# Patient Record
Sex: Male | Born: 1995 | Hispanic: Yes | Marital: Single | Smoking: Never smoker
Health system: Southern US, Community
[De-identification: ages and names within clinical notes are randomized; demographics above are authoritative.]

---
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
## 2021-04-12 ENCOUNTER — Encounter (HOSPITAL_BASED_OUTPATIENT_CLINIC_OR_DEPARTMENT_OTHER): Payer: Self-pay

## 2021-04-12 ENCOUNTER — Other Ambulatory Visit: Payer: Self-pay

## 2021-04-12 ENCOUNTER — Emergency Department (HOSPITAL_BASED_OUTPATIENT_CLINIC_OR_DEPARTMENT_OTHER): Payer: Self-pay | Admitting: Radiology

## 2021-04-12 ENCOUNTER — Emergency Department (HOSPITAL_BASED_OUTPATIENT_CLINIC_OR_DEPARTMENT_OTHER)
Admission: EM | Admit: 2021-04-12 | Discharge: 2021-04-13 | Disposition: A | Discharge status: Home / Self Care | Payer: Self-pay | Attending: Emergency Medicine | Admitting: Emergency Medicine

## 2021-04-12 DIAGNOSIS — W500XXA Accidental hit or strike by another person, initial encounter: Secondary | ICD-10-CM | POA: Insufficient documentation

## 2021-04-12 DIAGNOSIS — Y9366 Activity, soccer: Secondary | ICD-10-CM | POA: Insufficient documentation

## 2021-04-12 DIAGNOSIS — S42022A Displaced fracture of shaft of left clavicle, initial encounter for closed fracture: Secondary | ICD-10-CM | POA: Insufficient documentation

## 2021-04-12 DIAGNOSIS — S4990XA Unspecified injury of shoulder and upper arm, unspecified arm, initial encounter: Secondary | ICD-10-CM

## 2021-04-12 MED ORDER — ACETAMINOPHEN 500 MG PO TABS
1000.0000 mg | ORAL_TABLET | Freq: Once | ORAL | Status: AC
  Administered 2021-04-13: 1000 mg via ORAL
  Filled 2021-04-12: qty 2
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
## 2021-04-13 MED ORDER — HYDROCODONE-ACETAMINOPHEN 5-325 MG PO TABS: 1.0000 | ORAL_TABLET | Freq: Three times a day (TID) | ORAL | 0 refills | Status: AC | PRN

## 2021-04-13 MED ORDER — HYDROCODONE-ACETAMINOPHEN 5-325 MG PO TABS: 1.0000 | ORAL_TABLET | Freq: Three times a day (TID) | ORAL | 0 refills | Status: DC | PRN

## 2021-04-13 NOTE — Discharge Instructions (Signed)
For pain control you may take at 1000 mg of Tylenol every 8 hours scheduled.  In addition you can take 0.5 to 1 tablet of Norco/Vicodin every 8 hours for pain not controlled with the scheduled Tylenol.  

## 2021-04-20 NOTE — Progress Notes (Signed)
Please enter orders for surgery scheduled for 04-25-21.

## 2021-04-20 NOTE — Patient Instructions (Addendum)
DUE TO COVID-19 ONLY ONE VISITOR IS ALLOWED TO COME WITH YOU AND STAY IN THE WAITING ROOM ONLY DURING PRE OP AND PROCEDURE.   **NO VISITORS ARE ALLOWED IN THE SHORT STAY AREA OR RECOVERY ROOM!!**    COVID SWAB TESTING MUST BE COMPLETED ON: Monday, 04-24-21 @ 11:15 AM   [address] [place], [place] [address]  (Must self quarantine after testing. Follow instructions on handout.)        Your procedure is scheduled on:  Tuesday, 04-25-21   Report to [facility] Main  Entrance    Report to admitting at 10:00 AM   Call this number if you have problems the morning of surgery [phone]   Do not eat food :After Midnight.   May have liquids until 9:00 AM  day of surgery  CLEAR LIQUID DIET  Foods Allowed                                                                     Foods Excluded  Water, Black Coffee and tea, regular and decaf             liquids that you cannot  Plain Jell-O in any flavor  (No red)                                   see through such as: Fruit ices (not with fruit pulp)                                      milk, soups, orange juice              Iced Popsicles (No red)                                      All solid food                                   Apple juices Sports drinks like Gatorade (No red) Lightly seasoned clear broth or consume(fat free) Sugar, honey syrup      Do NOT smoke after Midnight   Take these medicines the morning of surgery with A SIP OF WATER:  None                               You may not have any metal on your body including  jewelry, and body piercings             Do not wear  lotions, powders, cologne, or deodorant             Men may shave face and neck.   Do not bring valuables to the hospital. [facility] IS NOT  RESPONSIBLE   FOR VALUABLES.   Contacts, dentures or bridgework may not be worn into surgery.    Patients discharged the day of surgery will not be allowed to  drive home.              Please read over  the following fact sheets you were given: IF YOU HAVE QUESTIONS ABOUT YOUR PRE OP INSTRUCTIONS PLEASE  CALL [phone]  [place]- Preparing for Total Shoulder Arthroplasty    Before surgery, you can play an important role. Because skin is not sterile, your skin needs to be as free of germs as possible. You can reduce the number of germs on your skin by using the following products. . Benzoyl Peroxide Gel o Reduces the number of germs present on the skin o Applied twice a day to shoulder area starting two days before surgery    ==================================================================  Please follow these instructions carefully:  BENZOYL PEROXIDE 5% GEL  Please do not use if you have an allergy to benzoyl peroxide.   If your skin becomes reddened/irritated stop using the benzoyl peroxide.  Starting two days before surgery, apply as follows: 1. Apply benzoyl peroxide in the morning and at night. Apply after taking a shower. If you are not taking a shower clean entire shoulder front, back, and side along with the armpit with a clean wet washcloth.  2. Place a quarter-sized dollop on your shoulder and rub in thoroughly, making sure to cover the front, back, and side of your shoulder, along with the armpit.   2 days before ____ AM   ____ PM              1 day before ____ AM   ____ PM                         3. Do this twice a day for two days.  (Last application is the night before surgery, AFTER using the CHG soap as described below).  4. Do NOT apply benzoyl peroxide gel on the day of surgery.    [facility] - Preparing for Surgery Before surgery, you can play an important role.  Because skin is not sterile, your skin needs to be as free of germs as possible.  You can reduce the number of germs on your skin by washing with CHG (chlorahexidine gluconate) soap before surgery.  CHG is an antiseptic cleaner which kills germs and bonds with the skin to continue killing germs even  after washing. Please DO NOT use if you have an allergy to CHG or antibacterial soaps.  If your skin becomes reddened/irritated stop using the CHG and inform your nurse when you arrive at Short Stay. Do not shave (including legs and underarms) for at least 48 hours prior to the first CHG shower.  You may shave your face/neck.  Please follow these instructions carefully:  1.  Shower with CHG Soap the night before surgery and the  morning of surgery.  2.  If you choose to wash your hair, wash your hair first as usual with your normal  shampoo.  3.  After you shampoo, rinse your hair and body thoroughly to remove the shampoo.                             4.  Use CHG as you would any other liquid soap.  You can apply chg directly to the skin and wash.  Gently with a scrungie or clean washcloth.  5.  Apply the CHG Soap to your body ONLY FROM THE NECK DOWN.   Do   not  use on face/ open                           Wound or open sores. Avoid contact with eyes, ears mouth and   genitals (private parts).                       Wash face,  Genitals (private parts) with your normal soap.             6.  Wash thoroughly, paying special attention to the area where your    surgery  will be performed.  7.  Thoroughly rinse your body with warm water from the neck down.  8.  DO NOT shower/wash with your normal soap after using and rinsing off the CHG Soap.                9.  Pat yourself dry with a clean towel.            10.  Wear clean pajamas.            11.  Place clean sheets on your bed the night of your first shower and do not  sleep with pets. Day of Surgery : Do not apply any lotions/deodorants the morning of surgery.  Please wear clean clothes to the hospital/surgery center.  FAILURE TO FOLLOW THESE INSTRUCTIONS MAY RESULT IN THE CANCELLATION OF YOUR SURGERY  PATIENT SIGNATURE_________________________________  NURSE  SIGNATURE__________________________________  ________________________________________________________________________    Nicolas Jensen  An incentive spirometer is a tool that can help keep your lungs clear and active. This tool measures how well you are filling your lungs with each breath. Taking long deep breaths may help reverse or decrease the chance of developing breathing (pulmonary) problems (especially infection) following:  A long period of time when you are unable to move or be active. BEFORE THE PROCEDURE   If the spirometer includes an indicator to show your best effort, your nurse or respiratory therapist will set it to a desired goal.  If possible, sit up straight or lean slightly forward. Try not to slouch.  Hold the incentive spirometer in an upright position. INSTRUCTIONS FOR USE  1. Sit on the edge of your bed if possible, or sit up as far as you can in bed or on a chair. 2. Hold the incentive spirometer in an upright position. 3. Breathe out normally. 4. Place the mouthpiece in your mouth and seal your lips tightly around it. 5. Breathe in slowly and as deeply as possible, raising the piston or the ball toward the top of the column. 6. Hold your breath for 3-5 seconds or for as long as possible. Allow the piston or ball to fall to the bottom of the column. 7. Remove the mouthpiece from your mouth and breathe out normally. 8. Rest for a few seconds and repeat Steps 1 through 7 at least 10 times every 1-2 hours when you are awake. Take your time and take a few normal breaths between deep breaths. 9. The spirometer may include an indicator to show your best effort. Use the indicator as a goal to work toward during each repetition. 10. After each set of 10 deep breaths, practice coughing to be sure your lungs are clear. If you have an incision (the cut made at the time of surgery), support your incision when coughing by placing a pillow or rolled up towels firmly  against it. Once you are  able to get out of bed, walk around indoors and cough well. You may stop using the incentive spirometer when instructed by your caregiver.  RISKS AND COMPLICATIONS  Take your time so you do not get dizzy or light-headed.  If you are in pain, you may need to take or ask for pain medication before doing incentive spirometry. It is harder to take a deep breath if you are having pain. AFTER USE  Rest and breathe slowly and easily.  It can be helpful to keep track of a log of your progress. Your caregiver can provide you with a simple table to help with this. If you are using the spirometer at home, follow these instructions: [place] IF:   You are having difficultly using the spirometer.  You have trouble using the spirometer as often as instructed.  Your pain medication is not giving enough relief while using the spirometer.  You develop fever of 100.5 F (38.1 C) or higher. SEEK IMMEDIATE MEDICAL CARE IF:   You cough up bloody sputum that had not been present before.  You develop fever of 102 F (38.9 C) or greater.  You develop worsening pain at or near the incision site. MAKE SURE YOU:   Understand these instructions.  Will watch your condition.  Will get help right away if you are not doing well or get worse. Document Released: 04/22/2007 Document Revised: 03/03/2012 Document Reviewed: 06/23/2007 [facility] Patient Information 2014 [place], [place].   ________________________________________________________________________

## 2021-04-21 ENCOUNTER — Encounter (HOSPITAL_COMMUNITY): Payer: Self-pay

## 2021-04-21 ENCOUNTER — Other Ambulatory Visit: Payer: Self-pay

## 2021-04-21 ENCOUNTER — Encounter (HOSPITAL_COMMUNITY)
Admission: RE | Admit: 2021-04-21 | Discharge: 2021-04-21 | Disposition: A | Discharge status: Home / Self Care | Payer: No Typology Code available for payment source | Source: Ambulatory Visit | Attending: Orthopedic Surgery | Admitting: Orthopedic Surgery

## 2021-04-21 NOTE — Progress Notes (Signed)
COVID Vaccine Completed:  No Date COVID Vaccine completed: Has received booster: COVID vaccine manufacturer: Pfizer    [employer]   Date of COVID positive in last 90 days:  N/A  PCP - No PCP Cardiologist - N/A  Chest x-ray - N/A EKG - N/A Stress Test - N/A ECHO - N/A Cardiac Cath - N/A Pacemaker/ICD device last checked: Spinal Cord Stimulator:  Sleep Study - N/A CPAP -   Fasting Blood Sugar - N/A Checks Blood Sugar _____ times a day  Blood Thinner Instructions: N/A Aspirin Instructions: Last Dose:  Activity level:  Can go up a flight of stairs and perform activities of daily living without stopping and without symptoms of chest pain or shortness of breath.    Anesthesia review: N/A  Patient denies shortness of breath, fever, cough and chest pain at PAT appointment   Patient verbalized understanding of instructions that were given to them at the PAT appointment. Patient was also instructed that they will need to review over the PAT instructions again at home before surgery.

## 2021-04-24 ENCOUNTER — Other Ambulatory Visit (HOSPITAL_COMMUNITY)
Admission: RE | Admit: 2021-04-24 | Discharge: 2021-04-24 | Disposition: A | Discharge status: Home / Self Care | Payer: Self-pay | Source: Ambulatory Visit | Attending: Orthopedic Surgery | Admitting: Orthopedic Surgery

## 2021-04-24 DIAGNOSIS — Z01812 Encounter for preprocedural laboratory examination: Secondary | ICD-10-CM | POA: Insufficient documentation

## 2021-04-24 DIAGNOSIS — Z20822 Contact with and (suspected) exposure to covid-19: Secondary | ICD-10-CM | POA: Insufficient documentation

## 2021-04-25 ENCOUNTER — Ambulatory Visit (HOSPITAL_COMMUNITY)
Admission: RE | Admit: 2021-04-25 | Discharge: 2021-04-25 | Disposition: A | Discharge status: Home / Self Care | Payer: Self-pay | Attending: Orthopedic Surgery | Admitting: Orthopedic Surgery

## 2021-04-25 ENCOUNTER — Encounter (HOSPITAL_COMMUNITY)
Admission: RE | Disposition: A | Discharge status: Home / Self Care | Payer: Self-pay | Source: Home / Self Care | Attending: Orthopedic Surgery

## 2021-04-25 ENCOUNTER — Ambulatory Visit (HOSPITAL_COMMUNITY): Payer: Self-pay | Admitting: Certified Registered"

## 2021-04-25 ENCOUNTER — Ambulatory Visit (HOSPITAL_COMMUNITY): Payer: Self-pay

## 2021-04-25 ENCOUNTER — Encounter (HOSPITAL_COMMUNITY): Payer: Self-pay | Admitting: Orthopedic Surgery

## 2021-04-25 DIAGNOSIS — Z419 Encounter for procedure for purposes other than remedying health state, unspecified: Secondary | ICD-10-CM

## 2021-04-25 DIAGNOSIS — Z791 Long term (current) use of non-steroidal anti-inflammatories (NSAID): Secondary | ICD-10-CM | POA: Insufficient documentation

## 2021-04-25 DIAGNOSIS — Y9366 Activity, soccer: Secondary | ICD-10-CM | POA: Insufficient documentation

## 2021-04-25 DIAGNOSIS — S42022A Displaced fracture of shaft of left clavicle, initial encounter for closed fracture: Secondary | ICD-10-CM | POA: Insufficient documentation

## 2021-04-25 HISTORY — PX: ORIF CLAVICULAR FRACTURE: SHX5055

## 2021-04-25 LAB — CBC
HCT: 44.6 % (ref 39.0–52.0)
Hemoglobin: 15.3 g/dL (ref 13.0–17.0)
MCH: 28.7 pg (ref 26.0–34.0)
MCHC: 34.3 g/dL (ref 30.0–36.0)
MCV: 83.5 fL (ref 80.0–100.0)
Platelets: 188 10*3/uL (ref 150–400)
RBC: 5.34 MIL/uL (ref 4.22–5.81)
RDW: 12.6 % (ref 11.5–15.5)
WBC: 6.6 10*3/uL (ref 4.0–10.5)
nRBC: 0 % (ref 0.0–0.2)

## 2021-04-25 LAB — SARS CORONAVIRUS 2 (TAT 6-24 HRS): SARS Coronavirus 2: NEGATIVE

## 2021-04-25 SURGERY — OPEN REDUCTION INTERNAL FIXATION (ORIF) CLAVICULAR FRACTURE
Anesthesia: Regional | Laterality: Left

## 2021-04-25 MED ORDER — ROCURONIUM BROMIDE 10 MG/ML (PF) SYRINGE
PREFILLED_SYRINGE | INTRAVENOUS | Status: AC
  Filled 2021-04-25: qty 10

## 2021-04-25 MED ORDER — OXYCODONE-ACETAMINOPHEN 5-325 MG PO TABS
ORAL_TABLET | ORAL | Status: AC
  Administered 2021-04-25: 1
  Filled 2021-04-25: qty 1

## 2021-04-25 MED ORDER — LACTATED RINGERS IV SOLN: INTRAVENOUS | Status: DC

## 2021-04-25 MED ORDER — MIDAZOLAM HCL 2 MG/2ML IJ SOLN
1.0000 mg | INTRAMUSCULAR | Status: DC
  Administered 2021-04-25: 2 mg via INTRAVENOUS
  Filled 2021-04-25: qty 2

## 2021-04-25 MED ORDER — FENTANYL CITRATE (PF) 100 MCG/2ML IJ SOLN
INTRAMUSCULAR | Status: DC | PRN
  Administered 2021-04-25: 50 ug via INTRAVENOUS
  Administered 2021-04-25: 75 ug via INTRAVENOUS

## 2021-04-25 MED ORDER — ACETAMINOPHEN 500 MG PO TABS
1000.0000 mg | ORAL_TABLET | Freq: Once | ORAL | Status: AC
  Administered 2021-04-25: 1000 mg via ORAL
  Filled 2021-04-25: qty 2

## 2021-04-25 MED ORDER — CYCLOBENZAPRINE HCL 10 MG PO TABS: 10.0000 mg | ORAL_TABLET | Freq: Three times a day (TID) | ORAL | 1 refills | Status: AC | PRN

## 2021-04-25 MED ORDER — DEXAMETHASONE SODIUM PHOSPHATE 10 MG/ML IJ SOLN
INTRAMUSCULAR | Status: DC | PRN
  Administered 2021-04-25: 10 mg via INTRAVENOUS

## 2021-04-25 MED ORDER — OXYCODONE-ACETAMINOPHEN 5-325 MG PO TABS: 1.0000 | ORAL_TABLET | ORAL | 0 refills | Status: AC | PRN

## 2021-04-25 MED ORDER — MIDAZOLAM HCL 5 MG/5ML IJ SOLN
INTRAMUSCULAR | Status: DC | PRN
  Administered 2021-04-25: 2 mg via INTRAVENOUS

## 2021-04-25 MED ORDER — PROPOFOL 10 MG/ML IV BOLUS
INTRAVENOUS | Status: DC | PRN
  Administered 2021-04-25: 200 mg via INTRAVENOUS

## 2021-04-25 MED ORDER — SODIUM CHLORIDE 0.9 % IR SOLN
Status: DC | PRN
  Administered 2021-04-25: 1000 mL

## 2021-04-25 MED ORDER — LIDOCAINE 2% (20 MG/ML) 5 ML SYRINGE
INTRAMUSCULAR | Status: DC | PRN
  Administered 2021-04-25: 20 mg via INTRAVENOUS

## 2021-04-25 MED ORDER — FENTANYL CITRATE (PF) 100 MCG/2ML IJ SOLN: 25.0000 ug | INTRAMUSCULAR | Status: DC | PRN

## 2021-04-25 MED ORDER — CEFAZOLIN SODIUM-DEXTROSE 2-4 GM/100ML-% IV SOLN
2.0000 g | INTRAVENOUS | Status: AC
  Administered 2021-04-25: 2 g via INTRAVENOUS
  Filled 2021-04-25: qty 100

## 2021-04-25 MED ORDER — EPHEDRINE SULFATE-NACL 50-0.9 MG/10ML-% IV SOSY
PREFILLED_SYRINGE | INTRAVENOUS | Status: DC | PRN
  Administered 2021-04-25: 10 mg via INTRAVENOUS

## 2021-04-25 MED ORDER — LIDOCAINE 2% (20 MG/ML) 5 ML SYRINGE
INTRAMUSCULAR | Status: AC
  Filled 2021-04-25: qty 5

## 2021-04-25 MED ORDER — BUPIVACAINE HCL (PF) 0.5 % IJ SOLN
INTRAMUSCULAR | Status: DC | PRN
  Administered 2021-04-25: 15 mL via PERINEURAL

## 2021-04-25 MED ORDER — BUPIVACAINE LIPOSOME 1.3 % IJ SUSP
INTRAMUSCULAR | Status: DC | PRN
  Administered 2021-04-25: 10 mL via PERINEURAL

## 2021-04-25 MED ORDER — FENTANYL CITRATE (PF) 250 MCG/5ML IJ SOLN
INTRAMUSCULAR | Status: AC
  Filled 2021-04-25: qty 5

## 2021-04-25 MED ORDER — PROPOFOL 10 MG/ML IV BOLUS
INTRAVENOUS | Status: AC
  Filled 2021-04-25: qty 20

## 2021-04-25 MED ORDER — DEXAMETHASONE SODIUM PHOSPHATE 10 MG/ML IJ SOLN
INTRAMUSCULAR | Status: AC
  Filled 2021-04-25: qty 1

## 2021-04-25 MED ORDER — MIDAZOLAM HCL 2 MG/2ML IJ SOLN
INTRAMUSCULAR | Status: AC
  Filled 2021-04-25: qty 2

## 2021-04-25 MED ORDER — ORAL CARE MOUTH RINSE
15.0000 mL | Freq: Once | OROMUCOSAL | Status: AC
  Administered 2021-04-25: 15 mL via OROMUCOSAL

## 2021-04-25 MED ORDER — SUGAMMADEX SODIUM 200 MG/2ML IV SOLN
INTRAVENOUS | Status: DC | PRN
  Administered 2021-04-25: 200 mg via INTRAVENOUS

## 2021-04-25 MED ORDER — DEXMEDETOMIDINE (PRECEDEX) IN NS 20 MCG/5ML (4 MCG/ML) IV SYRINGE
PREFILLED_SYRINGE | INTRAVENOUS | Status: DC | PRN
  Administered 2021-04-25: 8 ug via INTRAVENOUS
  Administered 2021-04-25: 4 ug via INTRAVENOUS

## 2021-04-25 MED ORDER — ROCURONIUM BROMIDE 10 MG/ML (PF) SYRINGE
PREFILLED_SYRINGE | INTRAVENOUS | Status: DC | PRN
  Administered 2021-04-25: 70 mg via INTRAVENOUS
  Administered 2021-04-25: 20 mg via INTRAVENOUS

## 2021-04-25 MED ORDER — ONDANSETRON HCL 4 MG/2ML IJ SOLN
INTRAMUSCULAR | Status: DC | PRN
  Administered 2021-04-25: 4 mg via INTRAVENOUS

## 2021-04-25 MED ORDER — ONDANSETRON HCL 4 MG/2ML IJ SOLN
INTRAMUSCULAR | Status: AC
  Filled 2021-04-25: qty 2

## 2021-04-25 MED ORDER — BUPIVACAINE-EPINEPHRINE 0.5% -1:200000 IJ SOLN
INTRAMUSCULAR | Status: AC
  Filled 2021-04-25: qty 1

## 2021-04-25 MED ORDER — FENTANYL CITRATE (PF) 100 MCG/2ML IJ SOLN
50.0000 ug | INTRAMUSCULAR | Status: DC
  Administered 2021-04-25: 100 ug via INTRAVENOUS
  Filled 2021-04-25: qty 2

## 2021-04-25 MED ORDER — CHLORHEXIDINE GLUCONATE 0.12 % MT SOLN: 15.0000 mL | Freq: Once | OROMUCOSAL | Status: AC

## 2021-04-25 SURGICAL SUPPLY — 61 items
BAG ZIPLOCK 12X15 (MISCELLANEOUS) ×2 IMPLANT
BENZOIN TINCTURE PRP APPL 2/3 (GAUZE/BANDAGES/DRESSINGS) IMPLANT
BIT DRILL 2 CANN GRADUATED (BIT) ×2 IMPLANT
BIT DRILL 2.5 CANN ENDOSCOPIC (BIT) ×2 IMPLANT
BIT DRILL 2.7 (BIT) ×1
BIT DRILL 2.7X2.7/3XSCR ANKL (BIT) ×1 IMPLANT
BIT DRL 2.7X2.7/3XSCR ANKL (BIT) ×1
BOOTIES KNEE HIGH SLOAN (MISCELLANEOUS) IMPLANT
CHLORAPREP W/TINT 26 (MISCELLANEOUS) IMPLANT
CLAVICLE PLATE (L8H) (Orthopedic Implant) ×2 IMPLANT
COVER SURGICAL LIGHT HANDLE (MISCELLANEOUS) ×2 IMPLANT
COVER WAND RF STERILE (DRAPES) IMPLANT
DECANTER SPIKE VIAL GLASS SM (MISCELLANEOUS) IMPLANT
DERMABOND ADVANCED (GAUZE/BANDAGES/DRESSINGS) ×1
DERMABOND ADVANCED .7 DNX12 (GAUZE/BANDAGES/DRESSINGS) ×1 IMPLANT
DRAPE C-ARM 42X120 X-RAY (DRAPES) ×2 IMPLANT
DRAPE ORTHO 2.5IN SPLIT 77X108 (DRAPES) ×2 IMPLANT
DRAPE ORTHO SPLIT 77X108 STRL (DRAPES) ×2
DRAPE SURG 17X11 SM STRL (DRAPES) ×2 IMPLANT
DRAPE TOP 10253 STERILE (DRAPES) ×2 IMPLANT
DRAPE U-SHAPE 47X51 STRL (DRAPES) ×4 IMPLANT
DRSG ADAPTIC 3X8 NADH LF (GAUZE/BANDAGES/DRESSINGS) IMPLANT
DRSG AQUACEL AG ADV 3.5X 6 (GAUZE/BANDAGES/DRESSINGS) ×2 IMPLANT
DURAPREP 26ML APPLICATOR (WOUND CARE) ×2 IMPLANT
ELECT BLADE TIP CTD 4 INCH (ELECTRODE) ×2 IMPLANT
ELECT REM PT RETURN 15FT ADLT (MISCELLANEOUS) ×2 IMPLANT
GLOVE SURG ENC MOIS LTX SZ7.5 (GLOVE) ×2 IMPLANT
GLOVE SURG ENC MOIS LTX SZ8 (GLOVE) ×2 IMPLANT
GLOVE SURG LTX SZ7.5 (GLOVE) ×2 IMPLANT
KIT BASIN OR (CUSTOM PROCEDURE TRAY) ×2 IMPLANT
KIT TURNOVER KIT A (KITS) ×2 IMPLANT
MANIFOLD NEPTUNE II (INSTRUMENTS) ×2 IMPLANT
NEEDLE MAYO 6 CRC TAPER PT (NEEDLE) IMPLANT
NEEDLE MAYO CATGUT SZ4 (NEEDLE) IMPLANT
NS IRRIG 1000ML POUR BTL (IV SOLUTION) ×2 IMPLANT
PACK SHOULDER (CUSTOM PROCEDURE TRAY) ×2 IMPLANT
PASSER SUT SWANSON 36MM LOOP (INSTRUMENTS) IMPLANT
PLATE CLAV FX CENTRAL 3RD LT (Plate) ×2 IMPLANT
PROTECTOR NERVE ULNAR (MISCELLANEOUS) IMPLANT
SCREW CORTICAL 2.7X18 LO PRO (Screw) ×2 IMPLANT
SCREW LOCK T15 FT 14X3.5XST (Screw) ×2 IMPLANT
SCREW LOCK T15 FT 16X3.5XST (Screw) ×2 IMPLANT
SCREW LOCKING 3.5X14MM (Screw) ×2 IMPLANT
SCREW LOCKING 3.5X16MM (Screw) ×2 IMPLANT
SCREW LOW PROFILE 3.5X14 (Screw) ×4 IMPLANT
SCREW LOW PROFILE 3.5X16 (Screw) ×2 IMPLANT
SLING ARM FOAM STRAP LRG (SOFTGOODS) ×2 IMPLANT
SLING ARM IMMOBILIZER LRG (SOFTGOODS) ×2 IMPLANT
SLING ARM IMMOBILIZER MED (SOFTGOODS) ×2 IMPLANT
STRIP CLOSURE SKIN 1/2X4 (GAUZE/BANDAGES/DRESSINGS) ×2 IMPLANT
SUCTION FRAZIER HANDLE 12FR (TUBING) ×1
SUCTION TUBE FRAZIER 12FR DISP (TUBING) ×1 IMPLANT
SUT BONE WAX W31G (SUTURE) IMPLANT
SUT ETHIBOND 2 OS 4 DA (SUTURE) IMPLANT
SUT ETHIBOND NAB CT1 #1 30IN (SUTURE) IMPLANT
SUT MNCRL AB 3-0 PS2 18 (SUTURE) ×2 IMPLANT
SUT MON AB 2-0 CT1 36 (SUTURE) ×2 IMPLANT
SUT VIC AB 1 CT1 36 (SUTURE) ×2 IMPLANT
SUT VIC AB 2-0 CT1 27 (SUTURE) ×1
SUT VIC AB 2-0 CT1 TAPERPNT 27 (SUTURE) ×1 IMPLANT
WATER STERILE IRR 1000ML POUR (IV SOLUTION) ×2 IMPLANT

## 2021-04-25 NOTE — Discharge Instructions (Signed)
Vania Rea. Supple, M.D., F.A.A.O.S. [facility] in Arthroscopic and Reconstructive Surgery of the Shoulder [phone] [address] [place], [place] [address] - Fax [phone]   POST-OP  INSTRUCTIONS  1. Follow up in the office for your first post-op appointment 10-14 days from the date of your surgery. If you do not already have a scheduled appointment, our office will contact you to schedule.  2. The bandage over your incision is waterproof. You may begin showering with this dressing on. You may leave this dressing on until first follow up appointment within 2 weeks. We prefer you leave this dressing in place until follow up however after 5-7 days if you are having itching or skin irritation and would like to remove it you may do so. Go slow and tug at the borders gently to break the bond the dressing has with the skin. At this point if there is no drainage it is okay to go without a bandage or you may cover it with a light guaze and tape. You can also expect significant bruising around your shoulder that will drift down your arm and into your chest wall. This is very normal and should resolve over several days.   3. Wear your sling at all times except to perform the exercises below or to occasionally let your arm dangle by your side to stretch your elbow. You also need to sleep in your sling immobilizer until instructed otherwise. It is ok to remove your sling if you are sitting in a controlled environment and allow your arm to rest in a position of comfort by your side or on your lap with pillows to give your neck and skin a break from the sling. You may remove it to allow arm to dangle by side to shower. If you are up walking around and when you go to sleep at night you need to wear it.  4. Range of motion to your elbow, wrist, and hand are encouraged 3-5 times daily. Exercise to your hand and fingers helps to reduce swelling you may experience.   5.  Prescriptions for a pain medication and a muscle relaxant are provided for you. It is recommended that if you are experiencing pain that you pain medication alone is not controlling, add the muscle relaxant along with the pain medication which can give additional pain relief. The first 1-2 days is generally the most severe of your pain and then should gradually decrease. As your pain lessens it is recommended that you decrease your use of the pain medications to an "as needed basis'" only and to always comply with the recommended dosages of the pain medications.  6. Pain medications can produce constipation along with their use. If you experience this, the use of an over the counter stool softener or laxative daily is recommended.   7. For additional questions or concerns, please do not hesitate to call the office. If after hours there is an answering service to forward your concerns to the physician on call.  8.Pain control following an exparel block  To help control your post-operative pain you received a nerve block  performed with Exparel which is a long acting anesthetic (numbing agent) which can provide pain relief and sensations of numbness (and relief of pain) in the operative shoulder and arm for up to 3 days. Sometimes it provides mixed relief, meaning you may still have numbness in certain areas of the arm but can still be able to move  parts of that arm, hand, and fingers. We recommend that your prescribed pain medications  be used as needed. We do not feel it is necessary to "pre medicate" and "stay ahead" of pain.  Taking narcotic pain medications when you are not having any pain can lead to unnecessary and potentially dangerous side effects.     Pendulum Exercises  Perform pendulum exercises while standing and bending at the waist. Support your uninvolved arm on a table or chair and allow your operated arm to hang freely. Make sure to do these exercises passively - not using you shoulder  muscles. These exercises can be performed once your nerve block effects have worn off.  Repeat 20 times. Do 3 sessions per day.

## 2021-04-25 NOTE — H&P (Signed)
Nicolas Jensen    Chief Complaint: Left clavicle fracture HPI: The patient is a 25 y.o. male who injured his left clavicle in a soccer match a week and a half ago sustaining a severely displaced comminuted midshaft clavicle fracture.  Due to the degree of displacement and shortening he is brought to the operating this time for planned open reduction and internal fixation  History reviewed. No pertinent past medical history.  History reviewed. No pertinent surgical history.  History reviewed. No pertinent family history.  Social History:  reports that he has never smoked. He has never used smokeless tobacco. He reports that he does not drink alcohol and does not use drugs.   Medications Prior to Admission  Medication Sig Dispense Refill  . ibuprofen (ADVIL) 200 MG tablet Take 400 mg by mouth every 6 (six) hours as needed for moderate pain.       Physical Exam: Patient is a well-developed, healthy appearing young male in obvious discomfort secondary to left shoulder pain centered and clavicle.  The skin is intact.  He is neurovascular intact in the left upper extremity.  Marked tenderness with palpation along the clavicular shaft.  Plain radiographs confirm a severely displaced and comminuted left midshaft clavicle fracture  Vitals  Temp:  [98.4 F (36.9 C)] 98.4 F (36.9 C) (05/03 1047) Pulse Rate:  [70-81] 79 (05/03 1215) Resp:  [11-29] 16 (05/03 1215) BP: (121-137)/(75-84) 137/82 (05/03 1215) SpO2:  [93 %-100 %] 99 % (05/03 1215)  Assessment/Plan  Impression: Left clavicle fracture  Plan of Action: Procedure(s): OPEN REDUCTION INTERNAL FIXATION (ORIF) CLAVICULAR FRACTURE  Ellyana Crigler M Jesselee Poth 04/25/2021, 12:37 PM Contact # [phone]

## 2021-04-25 NOTE — Anesthesia Procedure Notes (Signed)
Anesthesia Regional Block: Interscalene brachial plexus block   Pre-Anesthetic Checklist: ,, timeout performed, Correct Patient, Correct Site, Correct Laterality, Correct Procedure, Correct Position, site marked, Risks and benefits discussed,  Surgical consent,  Pre-op evaluation,  At surgeon's request and post-op pain management  Laterality: Left  Prep: Maximum Sterile Barrier Precautions used, chloraprep       Needles:  Injection technique: Single-shot  Needle Type: Echogenic Stimulator Needle     Needle Length: 4cm  Needle Gauge: 22     Additional Needles:   Procedures:,,,, ultrasound used (permanent image in chart),,,,  Narrative:  Start time: 04/25/2021 1:00 PM End time: 04/25/2021 1:10 PM Injection made incrementally with aspirations every 5 mL.  Performed by: Personally  Anesthesiologist: Elmer Picker, MD  Additional Notes: Monitors applied. No increased pain on injection. No increased resistance to injection. Injection made in 5cc increments. Good needle visualization. Patient tolerated procedure well.

## 2021-04-25 NOTE — Anesthesia Postprocedure Evaluation (Signed)
Anesthesia Post Note  Patient: Nicolas Jensen  Procedure(s) Performed: OPEN REDUCTION INTERNAL FIXATION (ORIF) CLAVICULAR FRACTURE (Left )     Patient location during evaluation: PACU Anesthesia Type: Regional and General Level of consciousness: awake and alert Pain management: pain level controlled Vital Signs Assessment: post-procedure vital signs reviewed and stable Respiratory status: spontaneous breathing, nonlabored ventilation, respiratory function stable and patient connected to nasal cannula oxygen Cardiovascular status: blood pressure returned to baseline and stable Postop Assessment: no apparent nausea or vomiting Anesthetic complications: no   No complications documented.  Last Vitals:  Vitals:   04/25/21 1637 04/25/21 1700  BP: (!) 135/93 135/90  Pulse: 75 81  Resp: 20   Temp: 36.8 C   SpO2: 95% 94%    Last Pain:  Vitals:   04/25/21 1700  TempSrc:   PainSc: 4                  Ewald Beg L [place]

## 2021-04-25 NOTE — Transfer of Care (Signed)
Immediate Anesthesia Transfer of Care Note  Patient: Nicolas Jensen  Procedure(s) Performed: OPEN REDUCTION INTERNAL FIXATION (ORIF) CLAVICULAR FRACTURE (Left )  Patient Location: PACU  Anesthesia Type:GA combined with regional for post-op pain  Level of Consciousness: drowsy, cooperative  Airway & Oxygen Therapy: Patient Spontanous Breathing and Patient connected to nasal cannula oxygen  Post-op Assessment: Report given to RN and Post -op Vital signs reviewed and stable  Post vital signs: Reviewed and stable  Last Vitals:  Vitals Value Taken Time  BP 129/78 04/25/21 1534  Temp    Pulse 86 04/25/21 1534  Resp 17 04/25/21 1534  SpO2 100 % 04/25/21 1534  Vitals shown include unvalidated device data.  Last Pain:  Vitals:   04/25/21 1047  TempSrc: Oral         Complications: No complications documented.

## 2021-04-25 NOTE — Anesthesia Preprocedure Evaluation (Addendum)
Anesthesia Evaluation  Patient identified by MRN, date of birth, ID band Patient awake    Reviewed: Allergy & Precautions, NPO status , Patient's Chart, lab work & pertinent test results  Airway Mallampati: I  TM Distance: >3 FB Neck ROM: Full    Dental no notable dental hx. (+) Teeth Intact, Dental Advisory Given   Pulmonary neg pulmonary ROS,    Pulmonary exam normal breath sounds clear to auscultation       Cardiovascular negative cardio ROS Normal cardiovascular exam Rhythm:Regular Rate:Normal     Neuro/Psych negative neurological ROS  negative psych ROS   GI/Hepatic negative GI ROS, Neg liver ROS,   Endo/Other  negative endocrine ROS  Renal/GU negative Renal ROS  negative genitourinary   Musculoskeletal negative musculoskeletal ROS (+)   Abdominal   Peds  Hematology negative hematology ROS (+)   Anesthesia Other Findings Left clavicle fracture  Reproductive/Obstetrics                            Anesthesia Physical Anesthesia Plan  ASA: I  Anesthesia Plan: General and Regional   Post-op Pain Management:  Regional for Post-op pain   Induction: Intravenous  PONV Risk Score and Plan: 2 and Midazolam, Dexamethasone and Ondansetron  Airway Management Planned: Oral ETT  Additional Equipment:   Intra-op Plan:   Post-operative Plan: Extubation in OR  Informed Consent: I have reviewed the patients History and Physical, chart, labs and discussed the procedure including the risks, benefits and alternatives for the proposed anesthesia with the patient or authorized representative who has indicated his/her understanding and acceptance.     Dental advisory given  Plan Discussed with: CRNA  Anesthesia Plan Comments:         Anesthesia Quick Evaluation

## 2021-04-25 NOTE — Progress Notes (Signed)
AssistedDr. Chelsey Woodrum with left, ultrasound guided, interscalene  block. Side rails up, monitors on throughout procedure. See vital signs in flow sheet. Tolerated Procedure well.  

## 2021-04-25 NOTE — Op Note (Signed)
04/25/2021  3:16 PM  PATIENT:   Nicolas Jensen  25 y.o. male  PRE-OPERATIVE DIAGNOSIS: Severely displaced left midshaft clavicle fracture  POST-OPERATIVE DIAGNOSIS: Same  PROCEDURE: Open reduction and internal fixation of displaced left midshaft clavicle fracture  SURGEON:  Case Vassell, Vania Rea M.D.  ASSISTANTS: Ralene Bathe, PA-C  ANESTHESIA:   General endotracheal and interscalene block with Exparel  EBL: Less than 100 cc  SPECIMEN: None  Drains: None   PATIENT DISPOSITION:  PACU - hemodynamically stable.    PLAN OF CARE: Discharge to home after PACU  Brief history:  Patient is a 25 year old male who sustained a severely displaced and moderately comminuted left midshaft clavicle fracture during fall while playing soccer.  On presentation to the office he was noted to have diffuse swelling about the left clavicular region with x-rays demonstrating almost 300% displacement of the clavicular shaft at the fracture site with a large butterfly fragment.  Due to the degree of displacement he is brought to the operating this time for planned open reduction and internal fixation.  Preoperatively the patient was counseled regarding treatment options as well as the potential risks versus benefits thereof.  Possible surgical complications were reviewed including bleeding, infection, neurovascular injury, persistent pain, malunion, nonunion, loss of fixation, and possible need for additional surgery, and potential for anesthetic complication.  He understands, and accepts, and agrees with our planned procedure.  Procedure in detail:  After undergoing routine preop evaluation the patient received prophylactic antibiotics and interscalene block with Exparel was established in the holding area by the anesthesia department.  Patient was subsequently placed supine on the operating table and underwent the smooth induction of a general endotracheal anesthesia.  Placed into the beachchair  position and appropriately padded protected.  Fluoroscopic imaging was then obtained to confirm appropriate visualization of the fracture site.  The left shoulder girdle region was sterilely prepped and draped in standard fashion.  Timeout was called.  A transverse 6 cm incision was then made along the inferior border of the clavicle centered over the fracture site.  Skin flaps were elevated dissection carried deeply and the clavicle was then exposed with subperiosteal dissection.  Fracture site was identified with interposed soft tissue removed.  The butterfly fragment was carefully mobilized such that it could be reduced.  The clavicle was mobilized such that we could then reduce the butterfly fragment and then reduce the fracture with alignment much to our satisfaction.  We then provisionally placed an 8 hole Arthrex clavicular plate with the appropriate contours to match the clavicular shaft.  Fluoroscopic imaging showed good alignment of the fracture site good position of the plate.  The plate was then fixed with standard cortical screws initially and once proper alignment was confirmed we then placed a lag screw from anterior to posterior transfixing the butterfly fragment and then completed fixation with a series of locking screws all of which obtained good purchase and fixation.  Final images showed good alignment of the fracture site good position the hardware.  The incision was then copiously irrigated.  Final hemostasis was obtained.  The deep periosteal layer was then reapproximated with a series of figure-of-eight number Vicryl sutures.  2-0 Vicryl used for subcu layer and intracuticular 3-0 Monocryl for the skin followed by Dermabond and Aquacel dressing.  The left arm was then placed into a sling.  The patient was awakened, extubated, and taken to the recovery room in stable condition.  Ralene Bathe, PA-C was utilized as an Geophysicist/field seismologist throughout this  case, essential for help with positioning the  patient, positioning extremity, tissue manipulation, implantation of the prosthesis, suture management, wound closure, and intraoperative decision-making.  Senaida Lange MD   Contact # [phone]

## 2021-04-25 NOTE — Anesthesia Procedure Notes (Signed)
Procedure Name: Intubation Performed by: Katelynn Heidler, CRNA Pre-anesthesia Checklist: Patient identified, Emergency Drugs available, Suction available and Patient being monitored Patient Re-evaluated:Patient Re-evaluated prior to induction Oxygen Delivery Method: Circle system utilized Preoxygenation: Pre-oxygenation with 100% oxygen Induction Type: IV induction Ventilation: Mask ventilation without difficulty Laryngoscope Size: Mac and 4 Grade View: Grade I Tube type: Oral Tube size: 7.5 mm Number of attempts: 1 Airway Equipment and Method: Stylet and Oral airway Placement Confirmation: ETT inserted through vocal cords under direct vision,  positive ETCO2 and breath sounds checked- equal and bilateral Secured at: 23 cm Tube secured with: Tape Dental Injury: Teeth and Oropharynx as per pre-operative assessment        

## 2021-04-28 ENCOUNTER — Encounter (HOSPITAL_COMMUNITY): Payer: Self-pay | Admitting: Orthopedic Surgery
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
